# Patient Record
Sex: Female | Born: 1955 | Race: White | Hispanic: No | Marital: Married
Health system: Southern US, Community
[De-identification: ages and names within clinical notes are randomized; demographics above are authoritative.]

## PROBLEM LIST (undated history)

## (undated) HISTORY — PX: BREAST BIOPSY: SHX20

---

## 1999-02-02 ENCOUNTER — Other Ambulatory Visit: Admission: RE | Admit: 1999-02-02 | Discharge: 1999-02-02 | Payer: Self-pay | Admitting: Gynecology

## 1999-02-17 ENCOUNTER — Encounter (INDEPENDENT_AMBULATORY_CARE_PROVIDER_SITE_OTHER): Payer: Self-pay | Admitting: Specialist

## 1999-02-17 ENCOUNTER — Other Ambulatory Visit: Admission: RE | Admit: 1999-02-17 | Discharge: 1999-02-17 | Payer: Self-pay | Admitting: Radiology

## 1999-06-08 ENCOUNTER — Other Ambulatory Visit: Admission: RE | Admit: 1999-06-08 | Discharge: 1999-06-08 | Payer: Self-pay | Admitting: Gynecology

## 1999-08-11 ENCOUNTER — Encounter (INDEPENDENT_AMBULATORY_CARE_PROVIDER_SITE_OTHER): Payer: Self-pay

## 1999-08-11 ENCOUNTER — Other Ambulatory Visit: Admission: RE | Admit: 1999-08-11 | Discharge: 1999-08-11 | Payer: Self-pay | Admitting: Gynecology

## 2000-01-06 ENCOUNTER — Other Ambulatory Visit: Admission: RE | Admit: 2000-01-06 | Discharge: 2000-01-06 | Payer: Self-pay | Admitting: Gynecology

## 2000-06-12 ENCOUNTER — Other Ambulatory Visit: Admission: RE | Admit: 2000-06-12 | Discharge: 2000-06-12 | Payer: Self-pay | Admitting: Gynecology

## 2001-05-22 ENCOUNTER — Other Ambulatory Visit: Admission: RE | Admit: 2001-05-22 | Discharge: 2001-05-22 | Payer: Self-pay | Admitting: Gynecology

## 2001-11-26 ENCOUNTER — Emergency Department (HOSPITAL_COMMUNITY): Admission: EM | Admit: 2001-11-26 | Discharge: 2001-11-26 | Payer: Self-pay | Admitting: Emergency Medicine

## 2002-03-20 ENCOUNTER — Other Ambulatory Visit: Admission: RE | Admit: 2002-03-20 | Discharge: 2002-03-20 | Payer: Self-pay | Admitting: Gynecology

## 2003-03-24 ENCOUNTER — Other Ambulatory Visit: Admission: RE | Admit: 2003-03-24 | Discharge: 2003-03-24 | Payer: Self-pay | Admitting: Gynecology

## 2003-04-25 ENCOUNTER — Encounter: Payer: Self-pay | Admitting: Gynecology

## 2003-04-25 ENCOUNTER — Encounter: Admission: RE | Admit: 2003-04-25 | Discharge: 2003-04-25 | Payer: Self-pay | Admitting: Gynecology

## 2004-03-16 ENCOUNTER — Other Ambulatory Visit: Admission: RE | Admit: 2004-03-16 | Discharge: 2004-03-16 | Payer: Self-pay | Admitting: Gynecology

## 2004-06-09 ENCOUNTER — Emergency Department (HOSPITAL_COMMUNITY): Admission: EM | Admit: 2004-06-09 | Discharge: 2004-06-09 | Payer: Self-pay | Admitting: Family Medicine

## 2005-04-04 ENCOUNTER — Other Ambulatory Visit: Admission: RE | Admit: 2005-04-04 | Discharge: 2005-04-04 | Payer: Self-pay | Admitting: Gynecology

## 2005-04-25 ENCOUNTER — Encounter: Admission: RE | Admit: 2005-04-25 | Discharge: 2005-04-25 | Payer: Self-pay | Admitting: Gynecology

## 2006-04-18 ENCOUNTER — Other Ambulatory Visit: Admission: RE | Admit: 2006-04-18 | Discharge: 2006-04-18 | Payer: Self-pay | Admitting: Gynecology

## 2006-04-27 ENCOUNTER — Encounter: Admission: RE | Admit: 2006-04-27 | Discharge: 2006-04-27 | Payer: Self-pay | Admitting: Gynecology

## 2006-05-19 ENCOUNTER — Encounter (INDEPENDENT_AMBULATORY_CARE_PROVIDER_SITE_OTHER): Payer: Self-pay | Admitting: Specialist

## 2006-05-19 ENCOUNTER — Ambulatory Visit (HOSPITAL_COMMUNITY): Admission: AD | Admit: 2006-05-19 | Discharge: 2006-05-20 | Payer: Self-pay | Admitting: Obstetrics and Gynecology

## 2007-05-15 ENCOUNTER — Encounter: Admission: RE | Admit: 2007-05-15 | Discharge: 2007-05-15 | Payer: Self-pay | Admitting: Gynecology

## 2007-05-17 ENCOUNTER — Other Ambulatory Visit: Admission: RE | Admit: 2007-05-17 | Discharge: 2007-05-17 | Payer: Self-pay | Admitting: Gynecology

## 2007-05-18 ENCOUNTER — Encounter: Admission: RE | Admit: 2007-05-18 | Discharge: 2007-05-18 | Payer: Self-pay | Admitting: Gynecology

## 2007-08-13 ENCOUNTER — Ambulatory Visit (HOSPITAL_COMMUNITY): Admission: RE | Admit: 2007-08-13 | Discharge: 2007-08-13 | Payer: Self-pay | Admitting: Gastroenterology

## 2008-01-07 ENCOUNTER — Emergency Department (HOSPITAL_COMMUNITY): Admission: EM | Admit: 2008-01-07 | Discharge: 2008-01-07 | Payer: Self-pay | Admitting: Emergency Medicine

## 2008-05-15 ENCOUNTER — Encounter: Admission: RE | Admit: 2008-05-15 | Discharge: 2008-05-15 | Payer: Self-pay | Admitting: Gynecology

## 2008-05-16 ENCOUNTER — Other Ambulatory Visit: Admission: RE | Admit: 2008-05-16 | Discharge: 2008-05-16 | Payer: Self-pay | Admitting: Gynecology

## 2009-05-28 ENCOUNTER — Encounter: Admission: RE | Admit: 2009-05-28 | Discharge: 2009-05-28 | Payer: Self-pay | Admitting: Gynecology

## 2010-05-31 ENCOUNTER — Encounter: Admission: RE | Admit: 2010-05-31 | Discharge: 2010-05-31 | Payer: Self-pay | Admitting: Gynecology

## 2010-06-05 ENCOUNTER — Emergency Department (HOSPITAL_BASED_OUTPATIENT_CLINIC_OR_DEPARTMENT_OTHER): Admission: EM | Admit: 2010-06-05 | Discharge: 2010-06-05 | Payer: Self-pay | Admitting: Emergency Medicine

## 2010-08-01 ENCOUNTER — Encounter: Payer: Self-pay | Admitting: Gynecology

## 2010-11-23 NOTE — Op Note (Signed)
NAMEGLORIE, Sylvia Marks                ACCOUNT NO.:  [id number]   MEDICAL RECORD NO.:  [id number]          PATIENT TYPE:  AMB   LOCATION:  ENDO                         FACILITY:  [facility]   PHYSICIAN:  Petra Kuba, M.D.    DATE OF BIRTH:  06-03-1956   DATE OF PROCEDURE:  08/13/2007  DATE OF DISCHARGE:                               OPERATIVE REPORT   PROCEDURE:  Colonoscopy.   INDICATION:  Family history with 1 aunt of colon cancer, due for colonic  screening.  Consent was signed after risks, benefits, methods, options  thoroughly discussed by the nurse in the office and me prior to  sedation.   MEDICINES USED:  Fentanyl 75 mcg, Versed 5 mg.   PROCEDURE IN DETAIL:  Rectal inspection was positive for right internal  hemorrhoid, small.  Digital exam was negative.  Video pediatric  colonoscope was inserted and easily advanced around the colon to the  cecum.  This did require some abdominal pressure, but no position  changes.  No abnormality was seen on insertion.  The cecum is identified  by the appendiceal orifice and the ileocecal valve.  The scope was  slowly withdrawn.  The prep was adequate.  There was minimal stool that  required washing and suctioning.  Slow withdrawal through the colon.  No  abnormalities were seen.  Specifically no polyps, tumors, masses, or  diverticula.  Once back in the rectum, anorectal culture and  retroflexion confirms some tiny hemorrhoids.  The scope was readvanced  slowly to the left side of the colon and the resection scope removed.  The patient tolerated the procedure well.  There was no obvious,  immediate complication.   ENDOSCOPIC FINDINGS:  1. Tiny internal and external hemorrhoids.  2. Otherwise within normal limits to the cecum.   PLAN:  Yearly rectals and guaiac for primary care.  See back p.r.n.  Repeat colon screening in 5-10 years.           ______________________________  Petra Kuba, M.D.     MEM/MEDQ  D:  08/13/2007  T:   08/13/2007  Job:  [id number]   cc:   Leatha Gilding. Mezer, M.D.  Fax: [phone]

## 2010-11-26 NOTE — Op Note (Signed)
NAMEJOE, Sylvia Marks                ACCOUNT NO.:  [id number]   MEDICAL RECORD NO.:  [id number]          PATIENT TYPE:  AMB   LOCATION:  SDC                           FACILITY:  [facility]   PHYSICIAN:  Miguel Aschoff, M.D.       DATE OF BIRTH:  12-May-1956   DATE OF PROCEDURE:  05/19/2006  DATE OF DISCHARGE:                                 OPERATIVE REPORT   PREOP DIAGNOSES:  1. Endometrial polyp.  2. Menorrhagia.   POSTOP DIAGNOSIS:  Submucosa uterine fibroid.   PROCEDURES:  1. Cervical dilatation.  2. Hysteroscopy.  3. Uterine curettage.   COMPLICATIONS:  Possible uterine perforation.   SURGEON:  Miguel Aschoff, M.D.   ANESTHESIA:  General.   BRIEF HISTORY:  The patient is a 54 year old white female noted on  hysterosalpingogram to have a 3-4 cm mass within the confines of the uterus  which has been causing her intramenstrual bleeding.  On ultrasound, this  looks like a polyp; and she presents now to undergo hysteroscopy with  removal of the polyp, D&C, and endometrial ablation.  The risks and benefits  of the procedure were discussed with the patient.   DESCRIPTION OF PROCEDURE:  The patient was taken to the operating room,  placed in the supine position.  General anesthesia was administered without  difficulty.  She was then placed in the dorsal lithotomy position; and  prepped and draped in the usual sterile fashion.  Bladder was catheterized.  Examination under anesthesia revealed normal external genitalia, normal  Bartholin's and Skene's glands, normal urethra.  The vaginal vault revealed  the cystocele and the rectocele.  No other abnormalities were noted.  The  cervix was without lesion.  The uterus was noted be anterior normal size and  shape.  The adnexa revealed no masses.  Once this was done, the speculum was  placed in the vaginal vault.  The anterior cervical lip was grasped with the  tenaculum; and then the cervix was sounded.  It appeared to be immediately  evident that  the sound passed beyond the confines of the uterus even though  there was no pressure applied to the wound.  At this point the sound was  removed.  The diagnostic hysteroscope was advanced.  No endocervical lesions  were noted.  No perforation was noted.  On inspection of the endometrial  cavity, what appeared to be a polyp on ultrasound proved to be a submucous  myoma on a broad stalk.  Attempts were made to remove this by polyp forceps;  it was not possible to do this.  At this point, the resectoscope was used;  again, the myoma was visualized; but it was not possible to get the  electrode beyond the confines of the polyp to remove it.  Further inspection  at the fundus of the uterine cavity, again, did appear to show perforation.  At this point it was elected to end the procedure; fluid deficit of 1000 mL  was noted.   Since the polyp was not been removed; and a uterine perforation was  suspected, endometrial ablation was not  carried out.  There were endometrial  curettings; and these were sent for histologic study.  The patient appeared  to be in satisfactory condition; and was brought to recovery room stable.  Plan is for the patient to be discharged home.  She will be informed about  the inability to remove the myoma hysteroscopically.  The endometrial  pathology will be reviewed.  If the pathology is normal, option will be, at  age 74, to have no further therapy for this versus either a future attempt  at resection versus hysterectomy.   The patient will be sent home on doxycycline 100 mg twice a day x5 days.  She is to call for the pathology report on Tuesday.  She is to call for any  problems such as severe pain or heavy bleeding.      Miguel Aschoff, M.D.  Electronically Signed     AR/MEDQ  D:  05/19/2006  T:  05/19/2006  Job:  [id number]

## 2011-05-06 ENCOUNTER — Other Ambulatory Visit: Payer: Self-pay | Admitting: Dermatology

## 2011-05-27 ENCOUNTER — Other Ambulatory Visit: Payer: Self-pay | Admitting: Gynecology

## 2011-05-27 DIAGNOSIS — Z1231 Encounter for screening mammogram for malignant neoplasm of breast: Secondary | ICD-10-CM

## 2011-06-24 ENCOUNTER — Ambulatory Visit
Admission: RE | Admit: 2011-06-24 | Discharge: 2011-06-24 | Disposition: A | Discharge status: Home / Self Care | Payer: 59 | Source: Ambulatory Visit | Attending: Gynecology | Admitting: Gynecology

## 2011-06-24 DIAGNOSIS — Z1231 Encounter for screening mammogram for malignant neoplasm of breast: Secondary | ICD-10-CM

## 2011-08-16 ENCOUNTER — Other Ambulatory Visit: Payer: Self-pay | Admitting: Dermatology

## 2012-04-27 ENCOUNTER — Other Ambulatory Visit: Payer: Self-pay | Admitting: Dermatology

## 2012-05-25 ENCOUNTER — Other Ambulatory Visit: Payer: Self-pay | Admitting: Gynecology

## 2012-05-25 DIAGNOSIS — Z1231 Encounter for screening mammogram for malignant neoplasm of breast: Secondary | ICD-10-CM

## 2012-06-28 ENCOUNTER — Ambulatory Visit: Payer: 59

## 2012-07-17 ENCOUNTER — Other Ambulatory Visit: Payer: Self-pay | Admitting: Gynecology

## 2012-08-25 ENCOUNTER — Other Ambulatory Visit: Payer: Self-pay

## 2013-05-16 ENCOUNTER — Other Ambulatory Visit: Payer: Self-pay

## 2013-06-19 ENCOUNTER — Other Ambulatory Visit (HOSPITAL_COMMUNITY)
Admission: RE | Admit: 2013-06-19 | Discharge: 2013-06-19 | Disposition: A | Discharge status: Home / Self Care | Payer: 59 | Source: Ambulatory Visit | Attending: Family Medicine | Admitting: Family Medicine

## 2013-06-19 ENCOUNTER — Other Ambulatory Visit: Payer: Self-pay | Admitting: Family Medicine

## 2013-06-19 DIAGNOSIS — Z0000 Encounter for general adult medical examination without abnormal findings: Secondary | ICD-10-CM | POA: Insufficient documentation

## 2013-06-19 DIAGNOSIS — Z1151 Encounter for screening for human papillomavirus (HPV): Secondary | ICD-10-CM | POA: Insufficient documentation

## 2013-06-27 ENCOUNTER — Ambulatory Visit
Admission: RE | Admit: 2013-06-27 | Discharge: 2013-06-27 | Disposition: A | Discharge status: Home / Self Care | Payer: 59 | Source: Ambulatory Visit | Attending: Gynecology | Admitting: Gynecology

## 2013-06-27 DIAGNOSIS — Z1231 Encounter for screening mammogram for malignant neoplasm of breast: Secondary | ICD-10-CM

## 2013-12-16 ENCOUNTER — Encounter (HOSPITAL_COMMUNITY): Payer: Self-pay | Admitting: Emergency Medicine

## 2013-12-16 ENCOUNTER — Emergency Department (HOSPITAL_COMMUNITY)
Admission: EM | Admit: 2013-12-16 | Discharge: 2013-12-16 | Disposition: A | Discharge status: Home / Self Care | Payer: 59 | Source: Home / Self Care | Attending: Family Medicine | Admitting: Family Medicine

## 2013-12-16 DIAGNOSIS — IMO0002 Reserved for concepts with insufficient information to code with codable children: Secondary | ICD-10-CM

## 2013-12-16 DIAGNOSIS — S01501A Unspecified open wound of lip, initial encounter: Secondary | ICD-10-CM

## 2013-12-16 DIAGNOSIS — S01511A Laceration without foreign body of lip, initial encounter: Secondary | ICD-10-CM

## 2013-12-16 NOTE — Discharge Instructions (Signed)
Thank you for coming in today. Return in about 1 week to remove the sutures.  Keep the wound covered with ointment.    Facial Laceration  A facial laceration is a cut on the face. These injuries can be painful and cause bleeding. Lacerations usually heal quickly, but they need special care to reduce scarring. DIAGNOSIS  Your health care provider will take a medical history, ask for details about how the injury occurred, and examine the wound to determine how deep the cut is. TREATMENT  Some facial lacerations may not require closure. Others may not be able to be closed because of an increased risk of infection. The risk of infection and the chance for successful closure will depend on various factors, including the amount of time since the injury occurred. The wound may be cleaned to help prevent infection. If closure is appropriate, pain medicines may be given if needed. Your health care provider will use stitches (sutures), wound glue (adhesive), or skin adhesive strips to repair the laceration. These tools bring the skin edges together to allow for faster healing and a better cosmetic outcome. If needed, you may also be given a tetanus shot. HOME CARE INSTRUCTIONS  Only take over-the-counter or prescription medicines as directed by your health care provider.  Follow your health care provider's instructions for wound care. These instructions will vary depending on the technique used for closing the wound. For Sutures:  Keep the wound clean and dry.   If you were given a bandage (dressing), you should change it at least once a day. Also change the dressing if it becomes wet or dirty, or as directed by your health care provider.   Wash the wound with soap and water 2 times a day. Rinse the wound off with water to remove all soap. Pat the wound dry with a clean towel.   After cleaning, apply a thin layer of the antibiotic ointment recommended by your health care provider. This will help  prevent infection and keep the dressing from sticking.   You may shower as usual after the first 24 hours. Do not soak the wound in water until the sutures are removed.   Get your sutures removed as directed by your health care provider. With facial lacerations, sutures should usually be taken out after 4 5 days to avoid stitch marks.   Wait a few days after your sutures are removed before applying any makeup. For Skin Adhesive Strips:  Keep the wound clean and dry.   Do not get the skin adhesive strips wet. You may bathe carefully, using caution to keep the wound dry.   If the wound gets wet, pat it dry with a clean towel.   Skin adhesive strips will fall off on their own. You may trim the strips as the wound heals. Do not remove skin adhesive strips that are still stuck to the wound. They will fall off in time.  For Wound Adhesive:  You may briefly wet your wound in the shower or bath. Do not soak or scrub the wound. Do not swim. Avoid periods of heavy sweating until the skin adhesive has fallen off on its own. After showering or bathing, gently pat the wound dry with a clean towel.   Do not apply liquid medicine, cream medicine, ointment medicine, or makeup to your wound while the skin adhesive is in place. This may loosen the film before your wound is healed.   If a dressing is placed over the wound, be careful  not to apply tape directly over the skin adhesive. This may cause the adhesive to be pulled off before the wound is healed.   Avoid prolonged exposure to sunlight or tanning lamps while the skin adhesive is in place.  The skin adhesive will usually remain in place for 5 10 days, then naturally fall off the skin. Do not pick at the adhesive film.  After Healing: Once the wound has healed, cover the wound with sunscreen during the day for 1 full year. This can help minimize scarring. Exposure to ultraviolet light in the first year will darken the scar. It can take 1 2  years for the scar to lose its redness and to heal completely.  SEEK IMMEDIATE MEDICAL CARE IF:  You have redness, pain, or swelling around the wound.   You see ayellowish-white fluid (pus) coming from the wound.   You have chills or a fever.  MAKE SURE YOU:  Understand these instructions.  Will watch your condition.  Will get help right away if you are not doing well or get worse. Document Released: 08/04/2004 Document Revised: 04/17/2013 Document Reviewed: 02/07/2013 [facility] Patient Information 2014 [place], [place].

## 2013-12-16 NOTE — ED Provider Notes (Signed)
Sylvia Marks is a 58 y.o. female who presents to Urgent Care today for lip laceration. Patient was hit in the upper lips today by a [employer] picture that fell out of her kitchen cabinet this morning. She applied compression and presented to the urgent care for repair. She denies any loss of consciousness headache dizziness or fogginess. She currently feels well. She has not tried any medications yet.  Patient states that she had a tetanus vaccination within the last 5 years   History reviewed. No pertinent past medical history. History  Substance Use Topics  . Smoking status: Not on file  . Smokeless tobacco: Not on file  . Alcohol Use: Not on file   ROS as above Medications: No current facility-administered medications for this encounter.   No current outpatient prescriptions on file.    Exam:  BP 116/67  Pulse 56  Temp(Src) 97.9 F (36.6 C) (Oral)  Resp 16  SpO2 99% Gen: Well NAD HEENT: EOMI,  MMM 1 cm laceration of the upper lip midline extending through the dermis. The laceration just crosses the vermilion border. No loose teeth   Laceration repair:  Consent obtained and timeout performed 1 mL lidocaine without epinephrine was injected into the wound achieving good anesthesia. The wound was copiously irrigated with sterile saline The surrounding skin was prepped with Betadine and draped in the usual sterile fashion. 3 simple interrupted sutures using 4-0 Prolene were used to close the wound Patient tolerated the procedure well  No results found for this or any previous visit (from the past 24 hour(s)). No results found.  Assessment and Plan: 58 y.o. female with lip laceration. Repaired. Return in one week for suture removal.  Discussed warning signs or symptoms. Please see discharge instructions. Patient expresses understanding.    Gregor Hams, MD 12/16/13 [phone]

## 2013-12-16 NOTE — ED Notes (Signed)
C/o lip laceration which happened as she was putting up dishes this morning when a rubbermaid pitcher fell off the shelf and hit patient in the lip States there was blood everywhere She did get "wooze" but did not past out Denies any vomiting Did ice the lip

## 2015-07-17 DIAGNOSIS — Z131 Encounter for screening for diabetes mellitus: Secondary | ICD-10-CM | POA: Diagnosis not present

## 2015-07-17 DIAGNOSIS — Z0000 Encounter for general adult medical examination without abnormal findings: Secondary | ICD-10-CM | POA: Diagnosis not present

## 2015-07-17 DIAGNOSIS — R5383 Other fatigue: Secondary | ICD-10-CM | POA: Diagnosis not present

## 2015-07-17 DIAGNOSIS — Z13220 Encounter for screening for lipoid disorders: Secondary | ICD-10-CM | POA: Diagnosis not present

## 2015-07-23 ENCOUNTER — Other Ambulatory Visit: Payer: Self-pay

## 2015-07-23 DIAGNOSIS — Z1231 Encounter for screening mammogram for malignant neoplasm of breast: Secondary | ICD-10-CM

## 2015-08-05 ENCOUNTER — Ambulatory Visit
Admission: RE | Admit: 2015-08-05 | Discharge: 2015-08-05 | Disposition: A | Discharge status: Home / Self Care | Payer: 59 | Source: Ambulatory Visit

## 2015-08-05 DIAGNOSIS — Z1231 Encounter for screening mammogram for malignant neoplasm of breast: Secondary | ICD-10-CM

## 2015-12-28 DIAGNOSIS — N951 Menopausal and female climacteric states: Secondary | ICD-10-CM | POA: Diagnosis not present

## 2015-12-28 DIAGNOSIS — R946 Abnormal results of thyroid function studies: Secondary | ICD-10-CM | POA: Diagnosis not present

## 2015-12-28 DIAGNOSIS — E559 Vitamin D deficiency, unspecified: Secondary | ICD-10-CM | POA: Diagnosis not present

## 2015-12-28 DIAGNOSIS — N952 Postmenopausal atrophic vaginitis: Secondary | ICD-10-CM | POA: Diagnosis not present

## 2015-12-28 DIAGNOSIS — N393 Stress incontinence (female) (male): Secondary | ICD-10-CM | POA: Diagnosis not present

## 2016-02-16 DIAGNOSIS — N952 Postmenopausal atrophic vaginitis: Secondary | ICD-10-CM | POA: Diagnosis not present

## 2016-02-16 DIAGNOSIS — N951 Menopausal and female climacteric states: Secondary | ICD-10-CM | POA: Diagnosis not present

## 2016-02-16 MED FILL — VIT D2 1.25 MG (50,000 UNIT: 1.25 MG | 56 days supply | Qty: 8 | Fill #0

## 2016-03-22 DIAGNOSIS — N952 Postmenopausal atrophic vaginitis: Secondary | ICD-10-CM | POA: Diagnosis not present

## 2016-03-22 DIAGNOSIS — N951 Menopausal and female climacteric states: Secondary | ICD-10-CM | POA: Diagnosis not present

## 2016-05-12 MED FILL — SOD SULFACET-SULFUR 10-5% C: 10-5 | 30 days supply | Qty: 170 | Fill #0

## 2016-05-24 DIAGNOSIS — G4700 Insomnia, unspecified: Secondary | ICD-10-CM | POA: Diagnosis not present

## 2016-05-24 DIAGNOSIS — N951 Menopausal and female climacteric states: Secondary | ICD-10-CM | POA: Diagnosis not present

## 2016-06-29 DIAGNOSIS — E559 Vitamin D deficiency, unspecified: Secondary | ICD-10-CM | POA: Diagnosis not present

## 2016-07-22 ENCOUNTER — Other Ambulatory Visit: Payer: Self-pay | Admitting: Family Medicine

## 2016-07-22 DIAGNOSIS — E2839 Other primary ovarian failure: Secondary | ICD-10-CM | POA: Diagnosis not present

## 2016-07-22 DIAGNOSIS — Z1231 Encounter for screening mammogram for malignant neoplasm of breast: Secondary | ICD-10-CM | POA: Diagnosis not present

## 2016-07-22 DIAGNOSIS — Z1239 Encounter for other screening for malignant neoplasm of breast: Secondary | ICD-10-CM

## 2016-07-22 DIAGNOSIS — Z0000 Encounter for general adult medical examination without abnormal findings: Secondary | ICD-10-CM | POA: Diagnosis not present

## 2016-08-09 ENCOUNTER — Ambulatory Visit
Admission: RE | Admit: 2016-08-09 | Discharge: 2016-08-09 | Disposition: A | Discharge status: Home / Self Care | Payer: 59 | Source: Ambulatory Visit | Attending: Family Medicine | Admitting: Family Medicine

## 2016-08-09 DIAGNOSIS — Z1231 Encounter for screening mammogram for malignant neoplasm of breast: Secondary | ICD-10-CM

## 2016-08-17 ENCOUNTER — Other Ambulatory Visit: Payer: 59

## 2016-09-07 DIAGNOSIS — E2839 Other primary ovarian failure: Secondary | ICD-10-CM | POA: Diagnosis not present

## 2016-09-07 DIAGNOSIS — M8588 Other specified disorders of bone density and structure, other site: Secondary | ICD-10-CM | POA: Diagnosis not present

## 2016-09-27 DIAGNOSIS — Z79890 Hormone replacement therapy (postmenopausal): Secondary | ICD-10-CM | POA: Diagnosis not present

## 2016-09-27 DIAGNOSIS — M85851 Other specified disorders of bone density and structure, right thigh: Secondary | ICD-10-CM | POA: Diagnosis not present

## 2016-09-27 DIAGNOSIS — N951 Menopausal and female climacteric states: Secondary | ICD-10-CM | POA: Diagnosis not present

## 2016-09-27 DIAGNOSIS — H6122 Impacted cerumen, left ear: Secondary | ICD-10-CM | POA: Diagnosis not present

## 2017-03-14 ENCOUNTER — Other Ambulatory Visit: Payer: 59

## 2017-08-03 ENCOUNTER — Other Ambulatory Visit: Payer: Self-pay | Admitting: Family Medicine

## 2017-08-03 ENCOUNTER — Other Ambulatory Visit (HOSPITAL_COMMUNITY)
Admission: RE | Admit: 2017-08-03 | Discharge: 2017-08-03 | Disposition: A | Discharge status: Home / Self Care | Payer: 59 | Source: Ambulatory Visit | Attending: Family Medicine | Admitting: Family Medicine

## 2017-08-03 DIAGNOSIS — D485 Neoplasm of uncertain behavior of skin: Secondary | ICD-10-CM | POA: Diagnosis not present

## 2017-08-03 DIAGNOSIS — Z131 Encounter for screening for diabetes mellitus: Secondary | ICD-10-CM | POA: Diagnosis not present

## 2017-08-03 DIAGNOSIS — Z0001 Encounter for general adult medical examination with abnormal findings: Secondary | ICD-10-CM | POA: Diagnosis not present

## 2017-08-03 DIAGNOSIS — Z124 Encounter for screening for malignant neoplasm of cervix: Secondary | ICD-10-CM | POA: Diagnosis not present

## 2017-08-03 DIAGNOSIS — N393 Stress incontinence (female) (male): Secondary | ICD-10-CM | POA: Diagnosis not present

## 2017-08-03 DIAGNOSIS — E2839 Other primary ovarian failure: Secondary | ICD-10-CM | POA: Diagnosis not present

## 2017-08-03 DIAGNOSIS — E559 Vitamin D deficiency, unspecified: Secondary | ICD-10-CM | POA: Diagnosis not present

## 2017-08-03 DIAGNOSIS — M85851 Other specified disorders of bone density and structure, right thigh: Secondary | ICD-10-CM | POA: Diagnosis not present

## 2017-08-04 LAB — CYTOLOGY - PAP: DIAGNOSIS: NEGATIVE

## 2017-08-10 ENCOUNTER — Other Ambulatory Visit: Payer: Self-pay | Admitting: Family Medicine

## 2017-08-10 DIAGNOSIS — Z139 Encounter for screening, unspecified: Secondary | ICD-10-CM

## 2017-08-29 ENCOUNTER — Ambulatory Visit
Admission: RE | Admit: 2017-08-29 | Discharge: 2017-08-29 | Disposition: A | Discharge status: Home / Self Care | Payer: 59 | Source: Ambulatory Visit | Attending: Family Medicine | Admitting: Family Medicine

## 2017-08-29 DIAGNOSIS — Z139 Encounter for screening, unspecified: Secondary | ICD-10-CM

## 2017-08-29 DIAGNOSIS — Z1231 Encounter for screening mammogram for malignant neoplasm of breast: Secondary | ICD-10-CM | POA: Diagnosis not present

## 2018-06-14 DIAGNOSIS — H524 Presbyopia: Secondary | ICD-10-CM | POA: Diagnosis not present

## 2018-06-14 DIAGNOSIS — H52223 Regular astigmatism, bilateral: Secondary | ICD-10-CM | POA: Diagnosis not present

## 2018-06-14 DIAGNOSIS — H5203 Hypermetropia, bilateral: Secondary | ICD-10-CM | POA: Diagnosis not present

## 2018-08-27 DIAGNOSIS — Z0000 Encounter for general adult medical examination without abnormal findings: Secondary | ICD-10-CM | POA: Diagnosis not present

## 2018-08-27 DIAGNOSIS — M85851 Other specified disorders of bone density and structure, right thigh: Secondary | ICD-10-CM | POA: Diagnosis not present

## 2018-08-27 DIAGNOSIS — Z131 Encounter for screening for diabetes mellitus: Secondary | ICD-10-CM | POA: Diagnosis not present

## 2018-08-27 DIAGNOSIS — Z13220 Encounter for screening for lipoid disorders: Secondary | ICD-10-CM | POA: Diagnosis not present

## 2019-05-21 DIAGNOSIS — H52223 Regular astigmatism, bilateral: Secondary | ICD-10-CM | POA: Diagnosis not present

## 2019-05-21 DIAGNOSIS — H524 Presbyopia: Secondary | ICD-10-CM | POA: Diagnosis not present

## 2019-05-21 DIAGNOSIS — H5203 Hypermetropia, bilateral: Secondary | ICD-10-CM | POA: Diagnosis not present

## 2019-09-10 DIAGNOSIS — Z13220 Encounter for screening for lipoid disorders: Secondary | ICD-10-CM | POA: Diagnosis not present

## 2019-09-10 DIAGNOSIS — Z131 Encounter for screening for diabetes mellitus: Secondary | ICD-10-CM | POA: Diagnosis not present

## 2019-09-10 DIAGNOSIS — Z0000 Encounter for general adult medical examination without abnormal findings: Secondary | ICD-10-CM | POA: Diagnosis not present

## 2019-09-10 DIAGNOSIS — M85851 Other specified disorders of bone density and structure, right thigh: Secondary | ICD-10-CM | POA: Diagnosis not present

## 2019-09-17 DIAGNOSIS — Z0000 Encounter for general adult medical examination without abnormal findings: Secondary | ICD-10-CM | POA: Diagnosis not present

## 2019-09-17 DIAGNOSIS — Z1211 Encounter for screening for malignant neoplasm of colon: Secondary | ICD-10-CM | POA: Diagnosis not present

## 2019-09-17 DIAGNOSIS — M85851 Other specified disorders of bone density and structure, right thigh: Secondary | ICD-10-CM | POA: Diagnosis not present

## 2019-09-17 DIAGNOSIS — Z13220 Encounter for screening for lipoid disorders: Secondary | ICD-10-CM | POA: Diagnosis not present

## 2019-09-17 DIAGNOSIS — E673 Hypervitaminosis D: Secondary | ICD-10-CM | POA: Diagnosis not present

## 2019-09-17 DIAGNOSIS — Z23 Encounter for immunization: Secondary | ICD-10-CM | POA: Diagnosis not present

## 2019-09-23 ENCOUNTER — Other Ambulatory Visit: Payer: Self-pay | Admitting: Family Medicine

## 2019-09-23 DIAGNOSIS — Z1231 Encounter for screening mammogram for malignant neoplasm of breast: Secondary | ICD-10-CM

## 2019-10-17 ENCOUNTER — Ambulatory Visit
Admission: RE | Admit: 2019-10-17 | Discharge: 2019-10-17 | Disposition: A | Discharge status: Home / Self Care | Payer: 59 | Source: Ambulatory Visit | Attending: Family Medicine | Admitting: Family Medicine

## 2019-10-17 ENCOUNTER — Other Ambulatory Visit: Payer: Self-pay

## 2019-10-17 DIAGNOSIS — Z1231 Encounter for screening mammogram for malignant neoplasm of breast: Secondary | ICD-10-CM

## 2019-10-31 DIAGNOSIS — Z1211 Encounter for screening for malignant neoplasm of colon: Secondary | ICD-10-CM | POA: Diagnosis not present

## 2019-11-08 MED FILL — PEG-3350 SOLUTION: 420 | 30 days supply | Qty: 4000 | Fill #0

## 2019-11-21 DIAGNOSIS — Z23 Encounter for immunization: Secondary | ICD-10-CM | POA: Diagnosis not present

## 2020-01-01 DIAGNOSIS — Z1159 Encounter for screening for other viral diseases: Secondary | ICD-10-CM | POA: Diagnosis not present

## 2020-01-06 DIAGNOSIS — Z1211 Encounter for screening for malignant neoplasm of colon: Secondary | ICD-10-CM | POA: Diagnosis not present

## 2020-08-05 IMAGING — MG DIGITAL SCREENING BILAT W/ TOMO W/ CAD
8 series · 9 of 24 positions shown · non-contrast
Comparison: Previous exam(s).

CLINICAL DATA: Screening.

EXAM:
DIGITAL SCREENING BILATERAL MAMMOGRAM WITH TOMO AND CAD

[L MLO synth-2D]
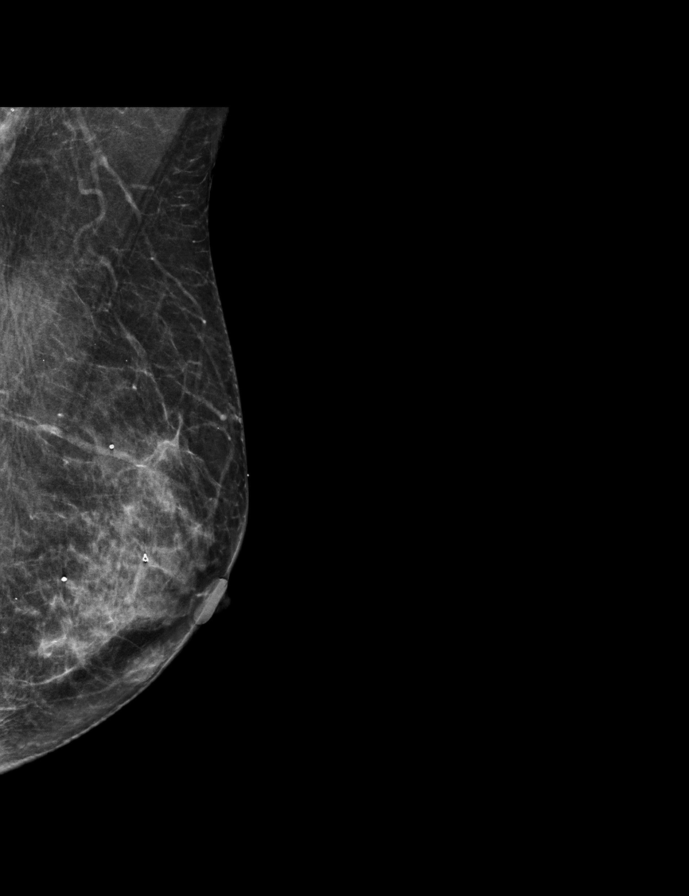

[R MLO synth-2D]
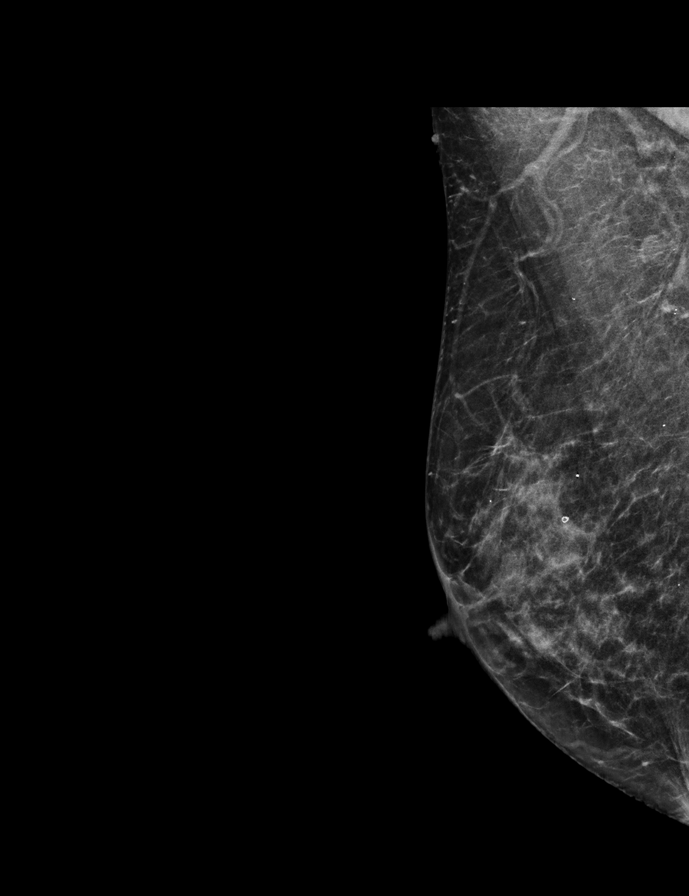

[R CC synth-2D]
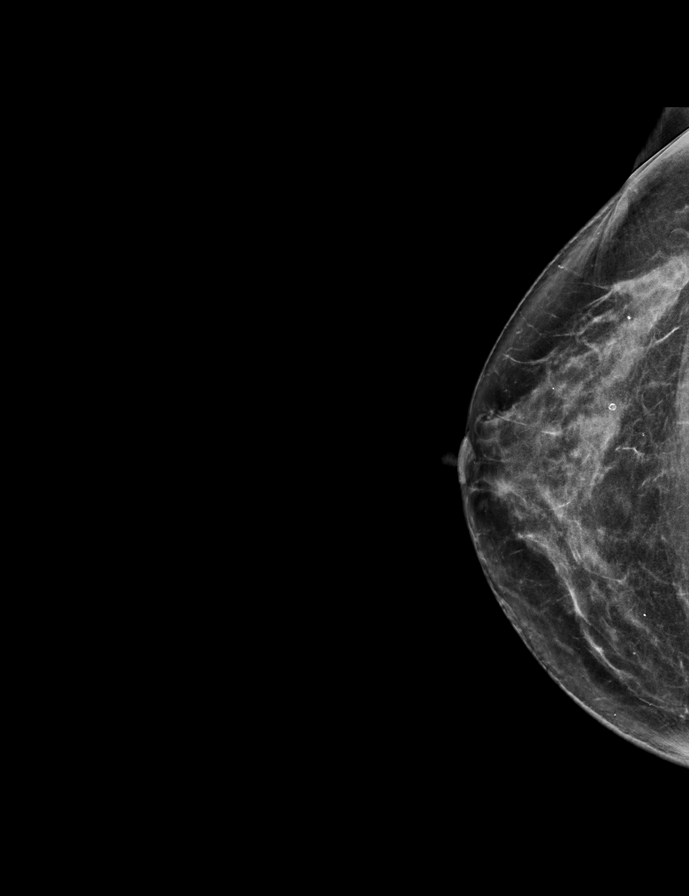

[L CC synth-2D]
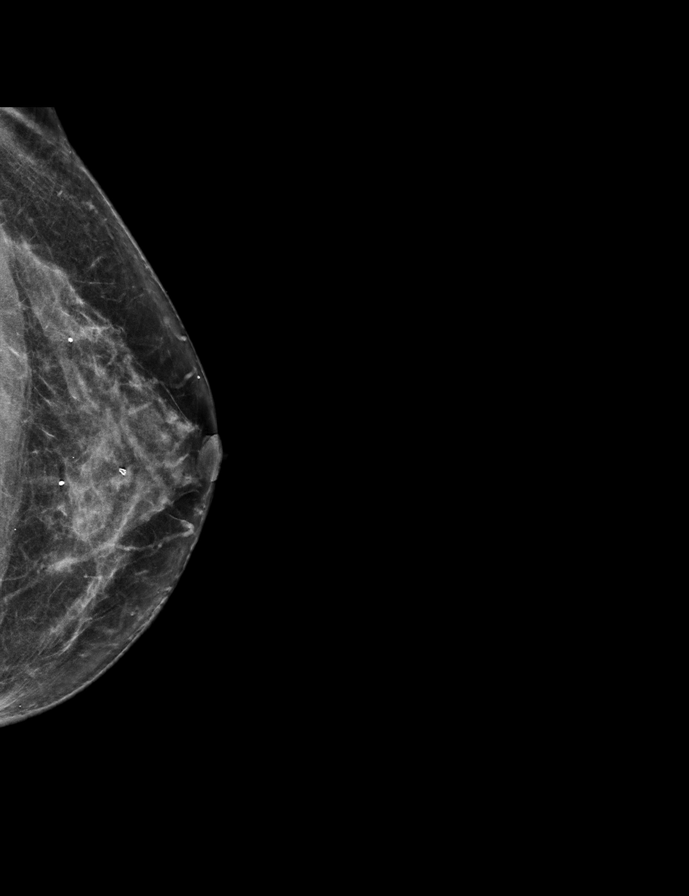

[L MLO tomo · 2 of 53 frames shown]
[frame 18/53]
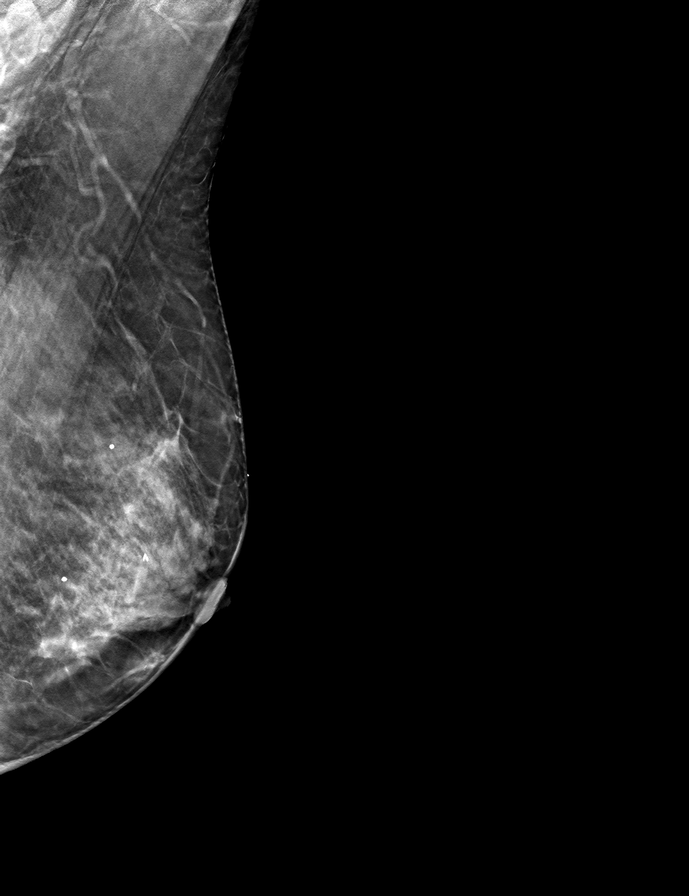
[frame 27/53]
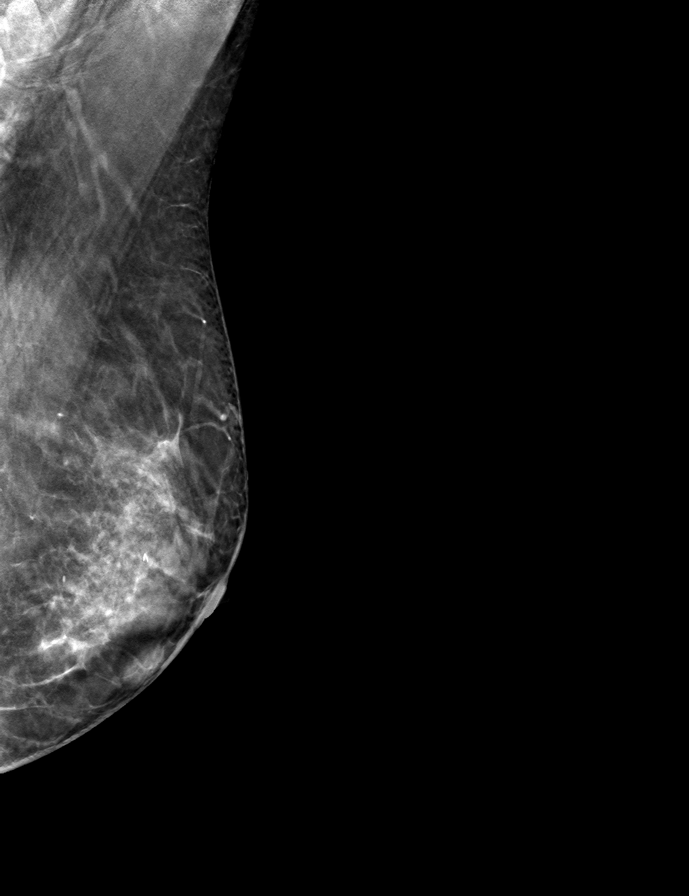

[R CC tomo · tomo slice 31/62.0]
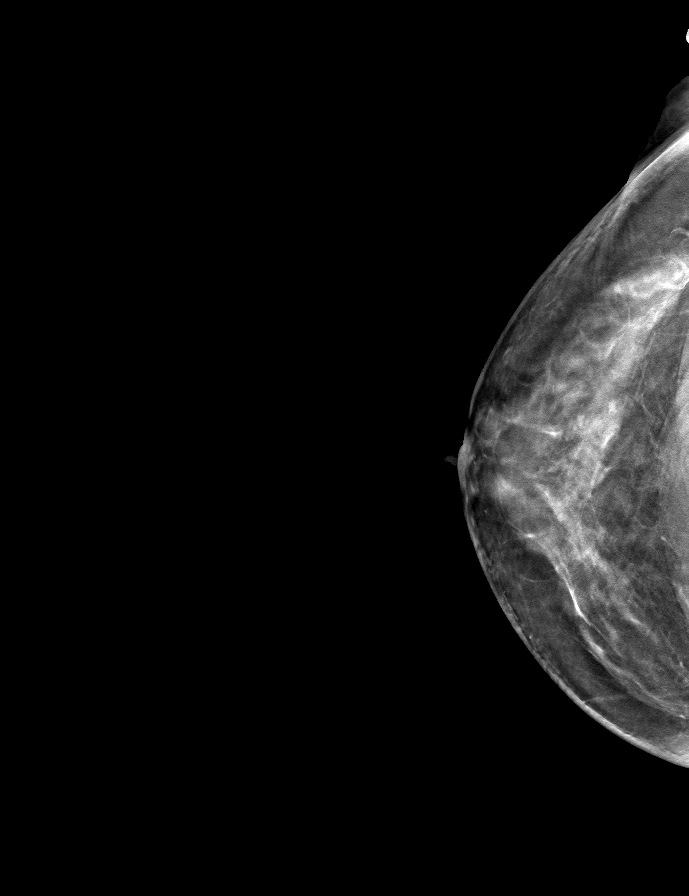

[L CC tomo · tomo slice 29/58.0]
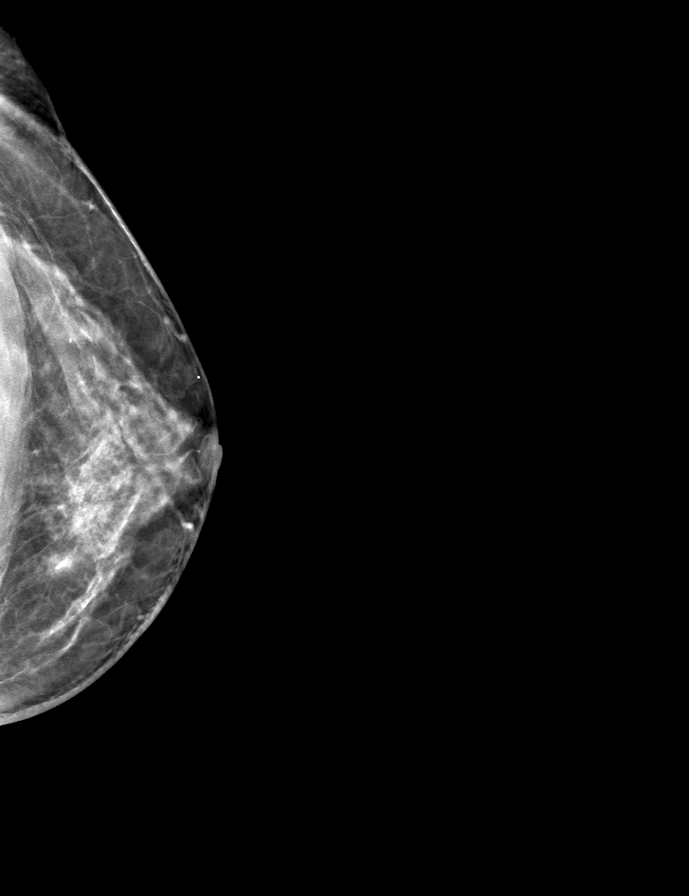

[R MLO tomo · tomo slice 27/52.0]
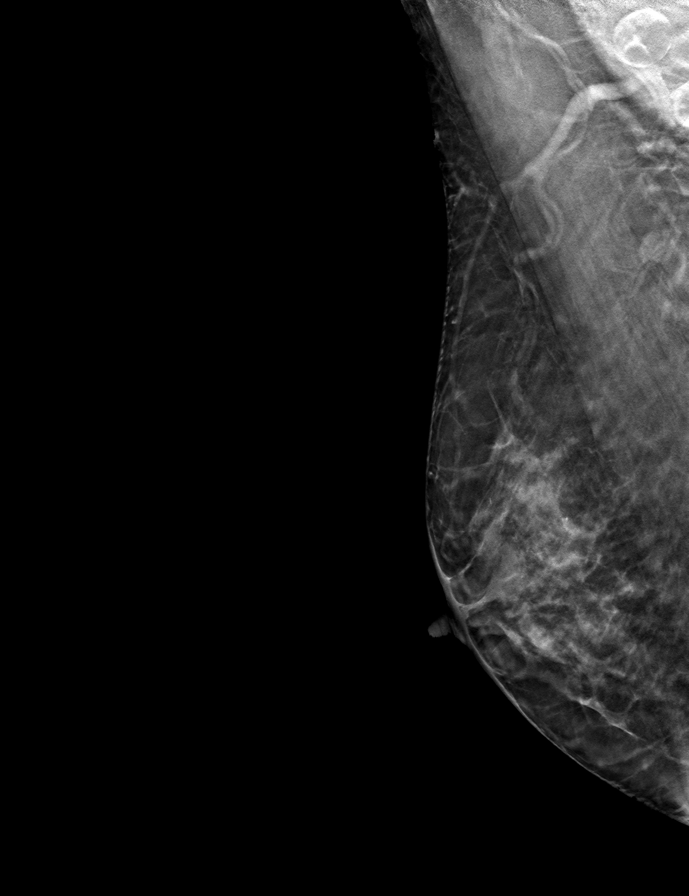

[9 of 24 positions shown; findings below may reference images not displayed]

ACR Breast Density Category c: The breast tissue is heterogeneously
dense, which may obscure small masses.
FINDINGS: There are no findings suspicious for malignancy. Images were
processed with CAD.
IMPRESSION: No mammographic evidence of malignancy. A result letter of this
screening mammogram will be mailed directly to the patient.

RECOMMENDATION:
Screening mammogram in one year. (Code:FT-U-LHB)

BI-RADS CATEGORY  1: Negative.

## 2020-09-15 DIAGNOSIS — M85851 Other specified disorders of bone density and structure, right thigh: Secondary | ICD-10-CM | POA: Diagnosis not present

## 2020-09-15 DIAGNOSIS — R799 Abnormal finding of blood chemistry, unspecified: Secondary | ICD-10-CM | POA: Diagnosis not present

## 2020-09-15 DIAGNOSIS — Z13220 Encounter for screening for lipoid disorders: Secondary | ICD-10-CM | POA: Diagnosis not present

## 2020-09-22 DIAGNOSIS — Z0000 Encounter for general adult medical examination without abnormal findings: Secondary | ICD-10-CM | POA: Diagnosis not present

## 2020-09-22 DIAGNOSIS — M85851 Other specified disorders of bone density and structure, right thigh: Secondary | ICD-10-CM | POA: Diagnosis not present

## 2020-09-22 DIAGNOSIS — Z124 Encounter for screening for malignant neoplasm of cervix: Secondary | ICD-10-CM | POA: Diagnosis not present

## 2020-09-22 DIAGNOSIS — D509 Iron deficiency anemia, unspecified: Secondary | ICD-10-CM | POA: Diagnosis not present

## 2020-09-22 DIAGNOSIS — N393 Stress incontinence (female) (male): Secondary | ICD-10-CM | POA: Diagnosis not present

## 2020-09-23 ENCOUNTER — Other Ambulatory Visit: Payer: Self-pay | Admitting: Family Medicine

## 2020-09-23 DIAGNOSIS — M85851 Other specified disorders of bone density and structure, right thigh: Secondary | ICD-10-CM

## 2020-10-08 ENCOUNTER — Other Ambulatory Visit: Payer: Self-pay | Admitting: Family Medicine

## 2020-10-08 DIAGNOSIS — M85851 Other specified disorders of bone density and structure, right thigh: Secondary | ICD-10-CM

## 2020-12-01 ENCOUNTER — Ambulatory Visit
Admission: RE | Admit: 2020-12-01 | Discharge: 2020-12-01 | Disposition: A | Discharge status: Home / Self Care | Payer: 59 | Source: Ambulatory Visit | Attending: Family Medicine | Admitting: Family Medicine

## 2020-12-01 ENCOUNTER — Other Ambulatory Visit: Payer: Self-pay

## 2020-12-01 DIAGNOSIS — D509 Iron deficiency anemia, unspecified: Secondary | ICD-10-CM | POA: Diagnosis not present

## 2020-12-01 DIAGNOSIS — N393 Stress incontinence (female) (male): Secondary | ICD-10-CM | POA: Diagnosis not present

## 2020-12-01 DIAGNOSIS — M85851 Other specified disorders of bone density and structure, right thigh: Secondary | ICD-10-CM

## 2020-12-01 DIAGNOSIS — Z124 Encounter for screening for malignant neoplasm of cervix: Secondary | ICD-10-CM | POA: Diagnosis not present

## 2020-12-01 DIAGNOSIS — Z1231 Encounter for screening mammogram for malignant neoplasm of breast: Secondary | ICD-10-CM | POA: Diagnosis not present

## 2020-12-01 DIAGNOSIS — Z0000 Encounter for general adult medical examination without abnormal findings: Secondary | ICD-10-CM | POA: Diagnosis not present

## 2021-03-16 ENCOUNTER — Other Ambulatory Visit: Payer: Self-pay

## 2021-03-16 ENCOUNTER — Ambulatory Visit
Admission: RE | Admit: 2021-03-16 | Discharge: 2021-03-16 | Disposition: A | Discharge status: Home / Self Care | Payer: 59 | Source: Ambulatory Visit | Attending: Family Medicine | Admitting: Family Medicine

## 2021-03-16 DIAGNOSIS — M85851 Other specified disorders of bone density and structure, right thigh: Secondary | ICD-10-CM | POA: Diagnosis not present

## 2021-03-16 DIAGNOSIS — Z780 Asymptomatic menopausal state: Secondary | ICD-10-CM | POA: Diagnosis not present

## 2021-03-16 DIAGNOSIS — M810 Age-related osteoporosis without current pathological fracture: Secondary | ICD-10-CM | POA: Diagnosis not present

## 2021-04-08 ENCOUNTER — Other Ambulatory Visit (HOSPITAL_COMMUNITY): Payer: Self-pay

## 2021-04-08 DIAGNOSIS — R0789 Other chest pain: Secondary | ICD-10-CM | POA: Diagnosis not present

## 2021-04-08 DIAGNOSIS — M816 Localized osteoporosis [Lequesne]: Secondary | ICD-10-CM | POA: Diagnosis not present

## 2021-04-08 MED ORDER — ALENDRONATE SODIUM 70 MG PO TABS
70.0000 mg | ORAL_TABLET | ORAL | 1 refills | Status: DC
  Filled 2021-04-08: qty 12, 84d supply, fill #0
  Filled 2021-12-13: qty 12, 84d supply, fill #1

## 2021-08-25 ENCOUNTER — Other Ambulatory Visit: Payer: Self-pay | Admitting: Family Medicine

## 2021-08-25 DIAGNOSIS — Z1231 Encounter for screening mammogram for malignant neoplasm of breast: Secondary | ICD-10-CM

## 2021-09-22 DIAGNOSIS — M816 Localized osteoporosis [Lequesne]: Secondary | ICD-10-CM | POA: Diagnosis not present

## 2021-09-22 DIAGNOSIS — D509 Iron deficiency anemia, unspecified: Secondary | ICD-10-CM | POA: Diagnosis not present

## 2021-09-28 DIAGNOSIS — M816 Localized osteoporosis [Lequesne]: Secondary | ICD-10-CM | POA: Diagnosis not present

## 2021-09-28 DIAGNOSIS — D509 Iron deficiency anemia, unspecified: Secondary | ICD-10-CM | POA: Diagnosis not present

## 2021-09-28 DIAGNOSIS — R0789 Other chest pain: Secondary | ICD-10-CM | POA: Diagnosis not present

## 2021-09-28 DIAGNOSIS — Z0001 Encounter for general adult medical examination with abnormal findings: Secondary | ICD-10-CM | POA: Diagnosis not present

## 2021-09-28 DIAGNOSIS — R1903 Right lower quadrant abdominal swelling, mass and lump: Secondary | ICD-10-CM | POA: Diagnosis not present

## 2021-09-28 DIAGNOSIS — Z23 Encounter for immunization: Secondary | ICD-10-CM | POA: Diagnosis not present

## 2021-10-04 ENCOUNTER — Other Ambulatory Visit: Payer: Self-pay | Admitting: Family Medicine

## 2021-10-04 DIAGNOSIS — R1903 Right lower quadrant abdominal swelling, mass and lump: Secondary | ICD-10-CM

## 2021-10-04 DIAGNOSIS — R0789 Other chest pain: Secondary | ICD-10-CM

## 2021-10-06 ENCOUNTER — Other Ambulatory Visit: Payer: Self-pay | Admitting: Family Medicine

## 2021-10-06 ENCOUNTER — Ambulatory Visit
Admission: RE | Admit: 2021-10-06 | Discharge: 2021-10-06 | Disposition: A | Discharge status: Home / Self Care | Payer: 59 | Source: Ambulatory Visit | Attending: Family Medicine | Admitting: Family Medicine

## 2021-10-06 DIAGNOSIS — R0789 Other chest pain: Secondary | ICD-10-CM

## 2021-10-06 DIAGNOSIS — R1903 Right lower quadrant abdominal swelling, mass and lump: Secondary | ICD-10-CM

## 2021-10-06 DIAGNOSIS — R10813 Right lower quadrant abdominal tenderness: Secondary | ICD-10-CM | POA: Diagnosis not present

## 2021-10-25 DIAGNOSIS — R1903 Right lower quadrant abdominal swelling, mass and lump: Secondary | ICD-10-CM | POA: Diagnosis not present

## 2021-12-02 ENCOUNTER — Ambulatory Visit
Admission: RE | Admit: 2021-12-02 | Discharge: 2021-12-02 | Disposition: A | Discharge status: Home / Self Care | Payer: 59 | Source: Ambulatory Visit | Attending: Family Medicine | Admitting: Family Medicine

## 2021-12-02 DIAGNOSIS — Z1231 Encounter for screening mammogram for malignant neoplasm of breast: Secondary | ICD-10-CM

## 2021-12-13 ENCOUNTER — Other Ambulatory Visit (HOSPITAL_COMMUNITY): Payer: Self-pay

## 2022-03-07 ENCOUNTER — Other Ambulatory Visit (HOSPITAL_COMMUNITY): Payer: Self-pay

## 2022-03-07 MED ORDER — ALENDRONATE SODIUM 70 MG PO TABS
70.0000 mg | ORAL_TABLET | ORAL | 1 refills | Status: AC
  Filled 2022-03-07: qty 12, 84d supply, fill #0

## 2022-04-19 DIAGNOSIS — H524 Presbyopia: Secondary | ICD-10-CM | POA: Diagnosis not present

## 2022-04-19 DIAGNOSIS — H5203 Hypermetropia, bilateral: Secondary | ICD-10-CM | POA: Diagnosis not present

## 2022-04-19 DIAGNOSIS — H52223 Regular astigmatism, bilateral: Secondary | ICD-10-CM | POA: Diagnosis not present

## 2022-05-05 DIAGNOSIS — L820 Inflamed seborrheic keratosis: Secondary | ICD-10-CM | POA: Diagnosis not present

## 2022-05-05 DIAGNOSIS — C44219 Basal cell carcinoma of skin of left ear and external auricular canal: Secondary | ICD-10-CM | POA: Diagnosis not present

## 2022-05-05 DIAGNOSIS — L578 Other skin changes due to chronic exposure to nonionizing radiation: Secondary | ICD-10-CM | POA: Diagnosis not present

## 2022-05-05 DIAGNOSIS — C44319 Basal cell carcinoma of skin of other parts of face: Secondary | ICD-10-CM | POA: Diagnosis not present

## 2022-05-05 DIAGNOSIS — D225 Melanocytic nevi of trunk: Secondary | ICD-10-CM | POA: Diagnosis not present

## 2022-05-05 DIAGNOSIS — L821 Other seborrheic keratosis: Secondary | ICD-10-CM | POA: Diagnosis not present

## 2022-05-05 DIAGNOSIS — L570 Actinic keratosis: Secondary | ICD-10-CM | POA: Diagnosis not present

## 2022-07-20 DIAGNOSIS — L538 Other specified erythematous conditions: Secondary | ICD-10-CM | POA: Diagnosis not present

## 2022-07-20 DIAGNOSIS — R208 Other disturbances of skin sensation: Secondary | ICD-10-CM | POA: Diagnosis not present

## 2022-07-20 DIAGNOSIS — L570 Actinic keratosis: Secondary | ICD-10-CM | POA: Diagnosis not present

## 2022-07-20 DIAGNOSIS — L814 Other melanin hyperpigmentation: Secondary | ICD-10-CM | POA: Diagnosis not present

## 2022-07-20 DIAGNOSIS — Z789 Other specified health status: Secondary | ICD-10-CM | POA: Diagnosis not present

## 2022-07-20 DIAGNOSIS — D1801 Hemangioma of skin and subcutaneous tissue: Secondary | ICD-10-CM | POA: Diagnosis not present

## 2022-07-20 DIAGNOSIS — L821 Other seborrheic keratosis: Secondary | ICD-10-CM | POA: Diagnosis not present

## 2022-07-20 DIAGNOSIS — B351 Tinea unguium: Secondary | ICD-10-CM | POA: Diagnosis not present

## 2022-07-20 DIAGNOSIS — L298 Other pruritus: Secondary | ICD-10-CM | POA: Diagnosis not present

## 2022-07-20 DIAGNOSIS — L820 Inflamed seborrheic keratosis: Secondary | ICD-10-CM | POA: Diagnosis not present

## 2022-10-04 DIAGNOSIS — M816 Localized osteoporosis [Lequesne]: Secondary | ICD-10-CM | POA: Diagnosis not present

## 2022-10-12 DIAGNOSIS — Z6822 Body mass index (BMI) 22.0-22.9, adult: Secondary | ICD-10-CM | POA: Diagnosis not present

## 2022-10-12 DIAGNOSIS — M816 Localized osteoporosis [Lequesne]: Secondary | ICD-10-CM | POA: Diagnosis not present

## 2022-10-12 DIAGNOSIS — Z0001 Encounter for general adult medical examination with abnormal findings: Secondary | ICD-10-CM | POA: Diagnosis not present

## 2022-10-12 DIAGNOSIS — H6123 Impacted cerumen, bilateral: Secondary | ICD-10-CM | POA: Diagnosis not present

## 2022-10-12 DIAGNOSIS — Z13220 Encounter for screening for lipoid disorders: Secondary | ICD-10-CM | POA: Diagnosis not present

## 2022-10-13 ENCOUNTER — Other Ambulatory Visit: Payer: Self-pay | Admitting: Family Medicine

## 2022-10-13 DIAGNOSIS — M816 Localized osteoporosis [Lequesne]: Secondary | ICD-10-CM

## 2022-10-19 ENCOUNTER — Other Ambulatory Visit: Payer: Self-pay | Admitting: Family Medicine

## 2022-10-19 DIAGNOSIS — Z1231 Encounter for screening mammogram for malignant neoplasm of breast: Secondary | ICD-10-CM

## 2022-12-13 ENCOUNTER — Ambulatory Visit
Admission: RE | Admit: 2022-12-13 | Discharge: 2022-12-13 | Disposition: A | Discharge status: Home / Self Care | Payer: 59 | Source: Ambulatory Visit | Attending: Family Medicine | Admitting: Family Medicine

## 2022-12-13 DIAGNOSIS — Z1231 Encounter for screening mammogram for malignant neoplasm of breast: Secondary | ICD-10-CM

## 2023-04-25 ENCOUNTER — Inpatient Hospital Stay
Admission: RE | Admit: 2023-04-25 | Discharge: 2023-04-25 | Disposition: A | Discharge status: Home / Self Care | Payer: 59 | Source: Ambulatory Visit | Attending: Family Medicine | Admitting: Family Medicine

## 2023-04-25 DIAGNOSIS — E349 Endocrine disorder, unspecified: Secondary | ICD-10-CM | POA: Diagnosis not present

## 2023-04-25 DIAGNOSIS — M8588 Other specified disorders of bone density and structure, other site: Secondary | ICD-10-CM | POA: Diagnosis not present

## 2023-04-25 DIAGNOSIS — N958 Other specified menopausal and perimenopausal disorders: Secondary | ICD-10-CM | POA: Diagnosis not present

## 2023-04-25 DIAGNOSIS — M816 Localized osteoporosis [Lequesne]: Secondary | ICD-10-CM

## 2023-04-27 ENCOUNTER — Other Ambulatory Visit: Payer: Commercial Managed Care - PPO

## 2023-07-25 DIAGNOSIS — C4401 Basal cell carcinoma of skin of lip: Secondary | ICD-10-CM | POA: Diagnosis not present

## 2023-07-25 DIAGNOSIS — Z789 Other specified health status: Secondary | ICD-10-CM | POA: Diagnosis not present

## 2023-07-25 DIAGNOSIS — L2989 Other pruritus: Secondary | ICD-10-CM | POA: Diagnosis not present

## 2023-07-25 DIAGNOSIS — D3701 Neoplasm of uncertain behavior of lip: Secondary | ICD-10-CM | POA: Diagnosis not present

## 2023-07-25 DIAGNOSIS — L820 Inflamed seborrheic keratosis: Secondary | ICD-10-CM | POA: Diagnosis not present

## 2023-07-25 DIAGNOSIS — L814 Other melanin hyperpigmentation: Secondary | ICD-10-CM | POA: Diagnosis not present

## 2023-07-25 DIAGNOSIS — D225 Melanocytic nevi of trunk: Secondary | ICD-10-CM | POA: Diagnosis not present

## 2023-07-25 DIAGNOSIS — L821 Other seborrheic keratosis: Secondary | ICD-10-CM | POA: Diagnosis not present

## 2023-07-25 DIAGNOSIS — L538 Other specified erythematous conditions: Secondary | ICD-10-CM | POA: Diagnosis not present

## 2023-10-25 ENCOUNTER — Other Ambulatory Visit: Payer: Self-pay | Admitting: Family Medicine

## 2023-10-25 DIAGNOSIS — Z1231 Encounter for screening mammogram for malignant neoplasm of breast: Secondary | ICD-10-CM

## 2023-10-30 ENCOUNTER — Other Ambulatory Visit (HOSPITAL_COMMUNITY): Payer: Self-pay | Admitting: Family Medicine

## 2023-10-30 DIAGNOSIS — R7989 Other specified abnormal findings of blood chemistry: Secondary | ICD-10-CM

## 2023-12-07 ENCOUNTER — Ambulatory Visit (HOSPITAL_COMMUNITY)
Admission: RE | Admit: 2023-12-07 | Discharge: 2023-12-07 | Disposition: A | Discharge status: Home / Self Care | Payer: Self-pay | Source: Ambulatory Visit | Attending: Family Medicine | Admitting: Family Medicine

## 2023-12-07 DIAGNOSIS — R7989 Other specified abnormal findings of blood chemistry: Secondary | ICD-10-CM | POA: Insufficient documentation

## 2023-12-19 ENCOUNTER — Ambulatory Visit
Admission: RE | Admit: 2023-12-19 | Discharge: 2023-12-19 | Disposition: A | Discharge status: Home / Self Care | Source: Ambulatory Visit | Attending: Family Medicine | Admitting: Family Medicine

## 2023-12-19 DIAGNOSIS — Z1231 Encounter for screening mammogram for malignant neoplasm of breast: Secondary | ICD-10-CM

## 2024-05-10 ENCOUNTER — Other Ambulatory Visit (HOSPITAL_COMMUNITY): Payer: Self-pay

## 2024-05-10 MED ORDER — FLUZONE HIGH-DOSE 0.5 ML IM SUSY
0.5000 mL | PREFILLED_SYRINGE | Freq: Once | INTRAMUSCULAR | 0 refills | Status: AC
  Filled 2024-05-10: qty 0.5, 1d supply, fill #0

## 2024-05-10 MED ORDER — COVID-19 MRNA VAC-TRIS(PFIZER) 30 MCG/0.3ML IM SUSY
0.3000 mL | PREFILLED_SYRINGE | Freq: Once | INTRAMUSCULAR | 0 refills | Status: AC
  Filled 2024-05-10 (×2): qty 0.3, 1d supply, fill #0
